# Patient Record
Sex: Female | Born: 1995 | Race: Black or African American | Hispanic: No | Marital: Single
Health system: Southern US, Community
[De-identification: ages and names within clinical notes are randomized; demographics above are authoritative.]

---
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
## 2017-07-22 DIAGNOSIS — R51 Headache: Secondary | ICD-10-CM | POA: Diagnosis not present

## 2017-08-13 DIAGNOSIS — Z760 Encounter for issue of repeat prescription: Secondary | ICD-10-CM | POA: Diagnosis not present

## 2017-08-13 DIAGNOSIS — J302 Other seasonal allergic rhinitis: Secondary | ICD-10-CM | POA: Diagnosis not present

## 2017-10-25 DIAGNOSIS — Z01419 Encounter for gynecological examination (general) (routine) without abnormal findings: Secondary | ICD-10-CM | POA: Diagnosis not present

## 2017-10-25 DIAGNOSIS — Z6823 Body mass index (BMI) 23.0-23.9, adult: Secondary | ICD-10-CM | POA: Diagnosis not present

## 2017-11-11 DIAGNOSIS — Z0100 Encounter for examination of eyes and vision without abnormal findings: Secondary | ICD-10-CM | POA: Diagnosis not present

## 2018-01-03 DIAGNOSIS — Z833 Family history of diabetes mellitus: Secondary | ICD-10-CM | POA: Diagnosis not present

## 2018-01-03 DIAGNOSIS — R51 Headache: Secondary | ICD-10-CM | POA: Diagnosis not present

## 2018-01-03 DIAGNOSIS — J302 Other seasonal allergic rhinitis: Secondary | ICD-10-CM | POA: Diagnosis not present

## 2018-01-03 DIAGNOSIS — G43909 Migraine, unspecified, not intractable, without status migrainosus: Secondary | ICD-10-CM | POA: Diagnosis not present

## 2018-01-05 DIAGNOSIS — L739 Follicular disorder, unspecified: Secondary | ICD-10-CM | POA: Diagnosis not present

## 2018-01-05 DIAGNOSIS — L0102 Bockhart's impetigo: Secondary | ICD-10-CM | POA: Diagnosis not present

## 2018-01-05 DIAGNOSIS — G43709 Chronic migraine without aura, not intractable, without status migrainosus: Secondary | ICD-10-CM | POA: Diagnosis not present

## 2018-02-23 DIAGNOSIS — Z23 Encounter for immunization: Secondary | ICD-10-CM | POA: Diagnosis not present

## 2018-02-24 DIAGNOSIS — J302 Other seasonal allergic rhinitis: Secondary | ICD-10-CM | POA: Diagnosis not present

## 2018-02-24 DIAGNOSIS — G43909 Migraine, unspecified, not intractable, without status migrainosus: Secondary | ICD-10-CM | POA: Diagnosis not present

## 2018-07-21 DIAGNOSIS — Z9109 Other allergy status, other than to drugs and biological substances: Secondary | ICD-10-CM | POA: Diagnosis not present

## 2018-08-20 DIAGNOSIS — M79675 Pain in left toe(s): Secondary | ICD-10-CM | POA: Diagnosis not present

## 2018-10-10 ENCOUNTER — Other Ambulatory Visit: Payer: Self-pay | Admitting: Critical Care Medicine

## 2018-10-16 LAB — NOVEL CORONAVIRUS, NAA: SARS-CoV-2, NAA: NOT DETECTED

## 2018-11-04 ENCOUNTER — Telehealth: Payer: Self-pay | Admitting: Pediatrics

## 2018-11-04 NOTE — Telephone Encounter (Signed)
Advised patient her COVID 19 test results came back negative.  °

## 2018-11-25 ENCOUNTER — Other Ambulatory Visit: Payer: Self-pay | Admitting: *Deleted
